# Patient Record
Sex: Male | Born: 1990 | Race: Black or African American | Hispanic: No
Health system: Southern US, Community
[De-identification: ages and names within clinical notes are randomized; demographics above are authoritative.]

---

## 2014-01-10 LAB — CBC
HCT: 44.7 % (ref 40.0–52.0)
HGB: 15 g/dL (ref 13.0–18.0)
MCH: 27.6 pg (ref 26.0–34.0)
MCHC: 33.5 g/dL (ref 32.0–36.0)
MCV: 82 fL (ref 80–100)
PLATELETS: 167 10*3/uL (ref 150–440)
RBC: 5.43 10*6/uL (ref 4.40–5.90)
RDW: 13.7 % (ref 11.5–14.5)
WBC: 14.1 10*3/uL — ABNORMAL HIGH (ref 3.8–10.6)

## 2014-01-10 LAB — COMPREHENSIVE METABOLIC PANEL
ANION GAP: 5 — AB (ref 7–16)
AST: 41 U/L — AB (ref 15–37)
Albumin: 3.8 g/dL (ref 3.4–5.0)
Alkaline Phosphatase: 44 U/L — ABNORMAL LOW
BILIRUBIN TOTAL: 0.9 mg/dL (ref 0.2–1.0)
BUN: 15 mg/dL (ref 7–18)
CALCIUM: 9.5 mg/dL (ref 8.5–10.1)
Chloride: 101 mmol/L (ref 98–107)
Co2: 27 mmol/L (ref 21–32)
Creatinine: 0.96 mg/dL (ref 0.60–1.30)
EGFR (Non-African Amer.): >60
GLUCOSE: 106 mg/dL — AB (ref 65–99)
Osmolality: 268 (ref 275–301)
Potassium: 3.8 mmol/L (ref 3.5–5.1)
SGPT (ALT): 31 U/L (ref 12–78)
Sodium: 133 mmol/L — ABNORMAL LOW (ref 136–145)
Total Protein: 9.2 g/dL — ABNORMAL HIGH (ref 6.4–8.2)

## 2014-01-11 ENCOUNTER — Inpatient Hospital Stay: Payer: Self-pay | Admitting: Specialist

## 2014-01-11 LAB — MONONUCLEOSIS SCREEN: Mono Test: NEGATIVE

## 2014-01-12 LAB — CBC WITH DIFFERENTIAL/PLATELET
BASOS ABS: 0 10*3/uL (ref 0.0–0.1)
Basophil %: 0.2 %
EOS ABS: 0 10*3/uL (ref 0.0–0.7)
Eosinophil %: 0.1 %
HCT: 42.2 % (ref 40.0–52.0)
HGB: 13.7 g/dL (ref 13.0–18.0)
LYMPHS ABS: 1.4 10*3/uL (ref 1.0–3.6)
Lymphocyte %: 8.4 %
MCH: 27.1 pg (ref 26.0–34.0)
MCHC: 32.5 g/dL (ref 32.0–36.0)
MCV: 84 fL (ref 80–100)
MONO ABS: 1.1 x10 3/mm — AB (ref 0.2–1.0)
Monocyte %: 6.8 %
NEUTROS PCT: 84.5 %
Neutrophil #: 14.1 10*3/uL — ABNORMAL HIGH (ref 1.4–6.5)
Platelet: 184 10*3/uL (ref 150–440)
RBC: 5.05 10*6/uL (ref 4.40–5.90)
RDW: 13.9 % (ref 11.5–14.5)
WBC: 16.7 10*3/uL — ABNORMAL HIGH (ref 3.8–10.6)

## 2014-01-13 LAB — BETA STREP CULTURE(ARMC)

## 2015-02-17 NOTE — Discharge Summary (Signed)
PATIENT NAME:  Jacob [name], Jacob [name] MR#:  [id number] DATE OF BIRTH:  01/17/1991  DATE OF ADMISSION:  01/11/2014 DATE OF DISCHARGE:  01/12/2014   For [name] detailed note, please see the history and physical done on admission by Dr. Randol KernElgergawy.   DIAGNOSES AT DISCHARGE: As follows: Sepsis secondary to acute tonsillitis and pharyngitis. Acute pharyngitis tonsillitis. Leukocytosis. Suspected sleep apnea.   DISPOSITION: The patient is being discharged on [name] mechanical soft regular diet.   ACTIVITY: As tolerated.   FOLLOWUP: Is with his primary care physician in [place].   DISCHARGE MEDICATIONS: Augmentin 875/125, 1 tablet twice daily x7 days and prednisone taper starting at 25 milligrams down to 5 milligrams over the next 5 days.   [facility]:  Ollen Grossaul S. Willeen CassBennett, MD from ENT.    PERTINENT STUDIES DONE DURING THE HOSPITAL COURSE: [name] CT scan of the neck done with contrast on March 17 showing enlargement and edematous tonsils bilaterally which abut each other at the midline with associated diffuse parapharyngeal swelling and edema. Findings are mostly consistent with acute tonsillitis and pharyngitis.   HOSPITAL COURSE: This is [name] 24 year old male with medical problems as mentioned above, presented to the hospital with fever and difficulty swallowing.   1. Sepsis. The patient presented with fever, tachycardia and noted to have CT scan findings suggestive of acute pharyngitis and tonsillitis, that was likely the source of the patient's sepsis. The patient was empirically started on IV Unasyn and IV steroids. Blood cultures were obtained which are currently negative. He has been afebrile and hemodynamically stable now for the past 48 hours, therefore being discharged on oral Augmentin and prednisone taper as stated.  2. Acute pharyngitis and tonsillitis. This is likely the cause of the patient's sepsis and has now since improved. The patient was seen by ENT, they did not think that the  patient needed any acute surgical intervention. He is now tolerating [name] soft diet well. He has been afebrile and hemodynamically stable. Therefore, I am discharging him on oral Augmentin and prednisone taper. He will continue followup with his doctor in [place].  3. Hypoxia and low O2 saturation. On the day after admission, the patient had some desaturations under deep sleep. His O2 saturation dropped as low as the mid 70s although the patient was clinically asymptomatic and was in deep sleep as mentioned. The patient likely has underlying sleep apnea. Therefore, he was advised to get [name] sleep study done as an outpatient.   CODE STATUS: The patient is [name] FULL CODE.   TIME SPENT: 40 minutes.   ____________________________ Rolly PancakeVivek J. Cherlynn KaiserSainani, MD [id number] D: 01/12/2014 16:21:08 ET T: 01/12/2014 23:24:34 ET JOB#: [id number]  cc: Rolly PancakeVivek J. Cherlynn KaiserSainani, MD, <Dictator> Houston SirenVIVEK J Lilac Hoff MD ELECTRONICALLY SIGNED 01/20/2014 20:26

## 2015-02-17 NOTE — Consult Note (Signed)
PATIENT NAME:  Jacob [name], [name] MR#:  [id number] DATE OF BIRTH:  1990-12-22  DATE OF CONSULTATION:  01/11/2014  REFERRING PHYSICIAN:  Dr. Randol KernElgergawy CONSULTING PHYSICIAN:  Ollen GrossPaul S. Willeen CassBennett, MD  REASON FOR CONSULTATION: Tonsillitis.   HISTORY OF PRESENT ILLNESS: This 24 year old male was admitted to the hospital last night for acute tonsillitis. He had an elevated white count of 14.1 with sore throat, difficulty swallowing and fever as high as 103. Sore throat had been bothering him for the past 2 days and was associated with throat pain, chills, difficulty swallowing and swelling of the glans of the neck. He has not had problems with tonsillitis in the past. CT scan of the neck showed tonsillitis with no evidence of abscess. There was no evidence of any deep neck abscess, just some adenopathy.   PAST MEDICAL HISTORY: None.   PAST SURGICAL HISTORY: None.   HOME MEDICATIONS: None.   FAMILY HISTORY: Significant for hypertension.   SOCIAL HISTORY: He does smoke cigars, but denies alcohol use or drug use.   REVIEW OF SYSTEMS: He has had fevers, chills, fatigue and weakness. He has not had any ear pain, hearing loss, tinnitus, epistaxis, nasal congestion, shortness of breath, cough, nausea, vomiting, chest pain or syncope. He has not had any rash.   PHYSICAL EXAMINATION:  VITAL SIGNS: Temperature 98.6, pulse 69 and blood pressure 114/73.  GENERAL: Well-developed, well-nourished male in no acute distress with no difficulty breathing or stridor.  HEENT: Head and face: Head is normocephalic, atraumatic. No facial skin lesions. Ears: External ears are unremarkable. Ear canals are free of cerumen. Tympanic membranes are clear bilaterally. Nose: External nose unremarkable. Nasal cavity is clear without purulence or polyps. The septum is midline. Oral cavity and oropharynx: Teeth, lips, and gums unremarkable. Tongue and floor of mouth are without edema or lesions. Posterior pharynx reveals 3+ cryptic  tonsils with erythema and scant exudate. The uvula is midline. There is no evidence of abscess.  NECK: The neck is supple with tender jugulodigastric adenopathy. Salivary glands are soft and nontender without masses. There is no thyromegaly.  NEUROLOGIC: Cranial nerves II through XII are grossly intact.   DATA REVIEW: I reviewed his CT scan report, which showed the sinuses to be clear and evidence of bilateral tonsil enlargement without evidence of abscess. There was no retropharyngeal fluid collection seen in the hypopharynx and larynx were normal. He did have some small nodes in the neck, the largest being 2 cm in size on the left. Monospot testing was found to be normal.   ASSESSMENT: The patient with acute tonsillitis, but no evidence of tonsil abscess. He does have quite a bit of tonsillar enlargement with this episode.   PLAN: I agree with his current treatment with IV Unasyn and dexamethasone. He could potentially be discharged home on Augmentin and a Sterapred taper to help further reduce the swelling in the tonsils. If has further problems with tonsillitis, he may want to follow up locally in [place] to see an ENT regarding possible tonsillectomy, but there has not been a history of this in the past.  ____________________________ Ollen GrossPaul S. Willeen CassBennett, MD psb:aw D: 01/11/2014 13:24:22 ET T: 01/11/2014 13:47:29 ET JOB#: [id number]  cc: Ollen GrossPaul S. Willeen CassBennett, MD, <Dictator> Sandi MealyPAUL S Kyjuan Gause MD ELECTRONICALLY SIGNED 01/18/2014 8:42

## 2015-02-17 NOTE — H&P (Signed)
PATIENT NAME:  Jacob [name], Jacob [name] MR#:  [id number] DATE OF BIRTH:  Oct 23, 1991  DATE OF ADMISSION:  01/11/2014  REFERRING PHYSICIAN: Dr. Harvest Dark.   PRIMARY CARE PHYSICIAN: None.   CHIEF COMPLAINT: Sore throat.   HISTORY OF PRESENT ILLNESS: This is a 24 year old male without significant past medical history who presents with complaints of sore throat. Reports has been going on for the last 2 days. As well reports some fever, some chills, some dysphagia and odynophagia and some swollen glands in the lymph nodes. As well, he reports some generalized weakness. Upon presentation, the patient was febrile at 103, tachycardic in the 120s. As well, he had significant leukocytosis. The patient had CT neck which did show large and edematous tonsils bilaterally with diffuse parapharyngeal swelling, with narrowing of oropharyngeal airway which remains midline in this patient. No deep space abscess could be appreciated with adenopathy. ED discussed with ENT on call who requested admission and for the patient to be started on IV Decadron and antibiotics.   PAST MEDICAL HISTORY: None.   PAST SURGICAL HISTORY: None.   HOME MEDICATIONS: None.   FAMILY HISTORY: Significant for hypertension.   SOCIAL HISTORY: The patient smokes cigars. No alcohol. No illicit drug use.   REVIEW OF SYSTEMS:  CONSTITUTIONAL: Reports fever, chills, fatigue, weakness.  EYES: Denies blurry vision, double vision, inflammation, glaucoma.  ENT: Denies tinnitus, ear pain, hearing loss, allergies, epistaxis, discharge. Reports sore throat and difficulty swallowing.  RESPIRATORY: Denies any shortness of breath. Denies any cough, wheezing, hemoptysis, dyspnea.  CARDIOVASCULAR: Denies chest pain, edema, arrhythmia, palpitation, syncope.  GASTROINTESTINAL: Denies nausea, vomiting, diarrhea, abdominal pain, hematemesis.  GENITOURINARY: Denies dysuria, hematuria, renal colic.  ENDOCRINE: Denies polyuria, polydipsia, heat or cold  intolerance.  HEMATOLOGY: Denies anemia, easy bruising, bleeding diathesis.  INTEGUMENT: Denies acne, rash or skin lesions.  MUSCULOSKELETAL: Denies any joint effusion, any swelling, gout or cramps.  NEUROLOGIC: Denies CVA, TIA, headache, ataxia, vertigo.  PSYCHIATRIC: Denies anxiety, insomnia or depression.   PHYSICAL EXAMINATION:  VITAL SIGNS: Temperature 103, pulse 102, respiratory rate 18, blood pressure 128/63, saturating 96% on room air. Pulse upon admission was 122.   GENERAL: Well-nourished male, looks comfortable in bed, in no apparent distress.  HEENT: Head atraumatic, normocephalic. Pupils equal, reactive to light. Pink conjunctivae. Anicteric sclerae Moist oral mucosa. The patient has tonsillar swelling with mild erythema. NECK: Has swollen lymph glands in the neck bilaterally. No thyromegaly. No JVD. No carotid bruits.  CHEST: Good air entry bilaterally. No wheezing, rales or rhonchi.  CARDIOVASCULAR: S1, S2 heard. No rubs, murmurs or gallops.  ABDOMEN: Soft, nontender, nondistended. Bowel sounds present.  EXTREMITIES: No edema. No clubbing. No cyanosis.  PSYCHIATRIC: Appropriate affect. Awake, alert x 3. Intact judgment and insight.  NEUROLOGIC: Cranial nerves grossly intact. Motor 5 out of 5. No focal deficits.  SKIN: Warm and dry. Normal skin turgor.  MUSCULOSKELETAL: No joint effusion or erythema.   PERTINENT LABORATORIES: Glucose 106, BUN 15, creatinine 0.96, sodium 133, potassium 3.8, chloride 101. ALT 31, AST 41, alk phos 44. White blood cells 14.1, hemoglobin 15.1, hematocrit 44.7, platelets 167.   CT neck with contrast showing enlarged and edematous tonsils bilaterally which abut each other at the midline with diffuse parapharyngeal swelling and edema.  ASSESSMENT AND PLAN:  1. Sepsis: Currently, the patient meets sepsis criteria. He is febrile, tachycardic and significant leukocytosis. His source of sepsis most likely is related to his tonsillitis and pharyngitis. The  patient will be started on intravenous antibiotics and fluids.  2. Emergency Department physician discussed with Ear, Nose and Throat on call. He will be started on intravenous Solu-Medrol and intravenous Unasyn and will follow on his Streptococcus culture and swab and mono test. Will keep on intravenous fluids. Will have the patient on clear liquid diet. Will advance as tolerated. Will have him on p.r.n. pain and nausea medicine. Will have him on Cepacol lozenges and  spray as needed.  3. Tobacco abuse: The patient was counseled.  4. Gastrointestinal prophylaxis: The patient will be started on Protonix as he is on Decadron.  5. Deep vein thrombosis prophylaxis: On subcutaneous heparin.   CODE STATUS: FULL CODE.   TOTAL TIME SPENT ON ADMISSION AND PATIENT CARE: 45 minutes.    ____________________________ Albertine Patricia, MD dse:gb D: 01/11/2014 00:56:36 ET T: 01/11/2014 01:53:20 ET JOB#: [id number]  cc: Albertine Patricia, MD, <Dictator> DAWOOD Graciela Husbands MD ELECTRONICALLY SIGNED 01/16/2014 1:43

## 2015-05-01 IMAGING — CT CT NECK WITH CONTRAST
4 of 5 series · 16 of 33 positions shown, 18 images · IV contrast (isovue)
Comparison: None available

CLINICAL DATA: Sore throat, fever.  Evaluate for deep space abscess

EXAM:
CT NECK WITH CONTRAST
TECHNIQUE: Multidetector CT imaging of the neck was performed using the
standard protocol following the bolus administration of intravenous
contrast.
CONTRAST:  75 cc of Isovue 370

[Series 4: sag neck · sagittal · 0.54mm/px · 5 of 81 slices shown, 6 images]
[im 27/81  bone]
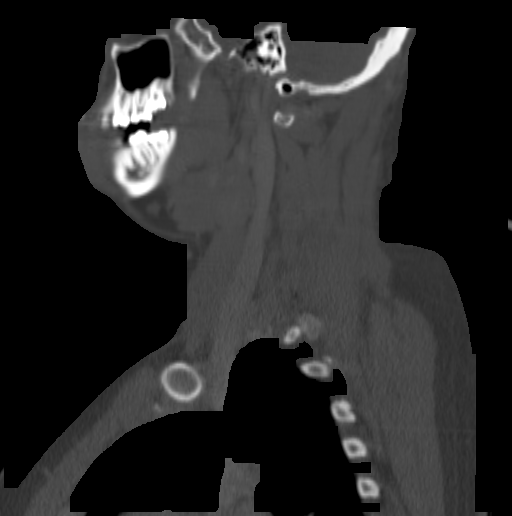
[im 34/81  bone]
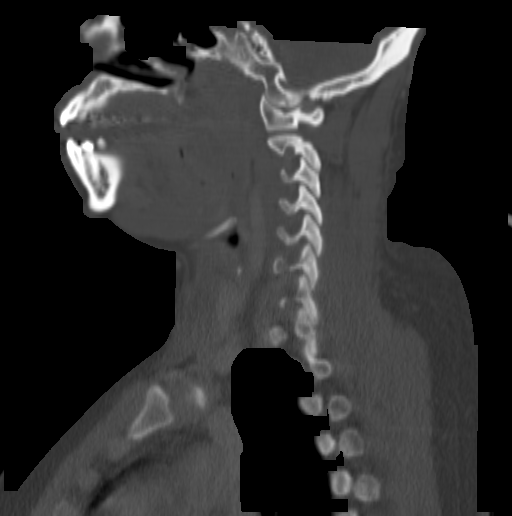
[im 41/81  soft-tissue]
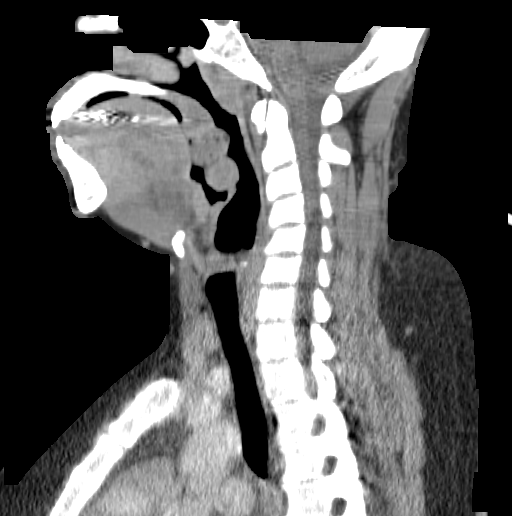
[im 41/81  bone]
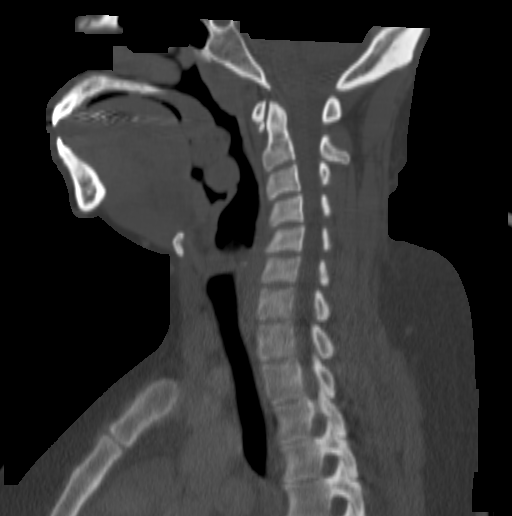
[im 47/81  bone]
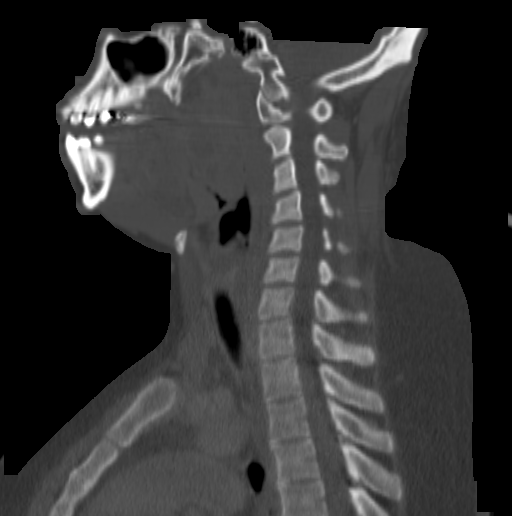
[im 54/81  bone]
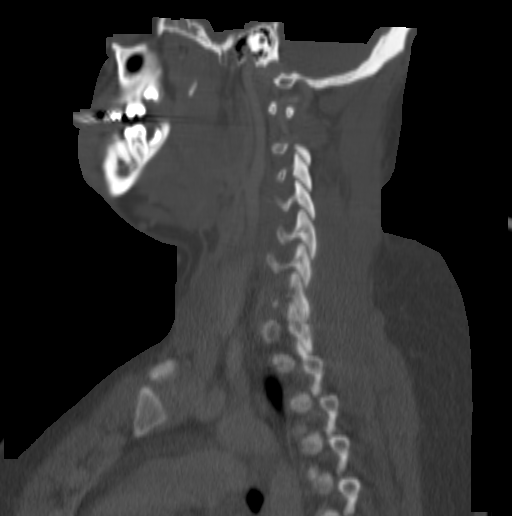

[Series 5: cor neck · coronal · 0.50mm/px · 3 of 125 slices shown]
[im 25/125  bone]
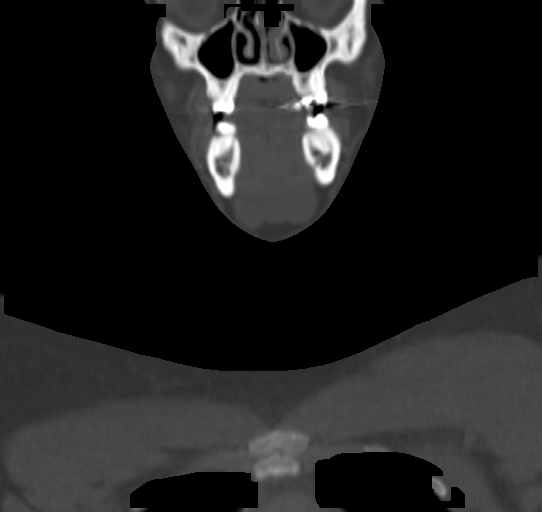
[im 50/125  bone]
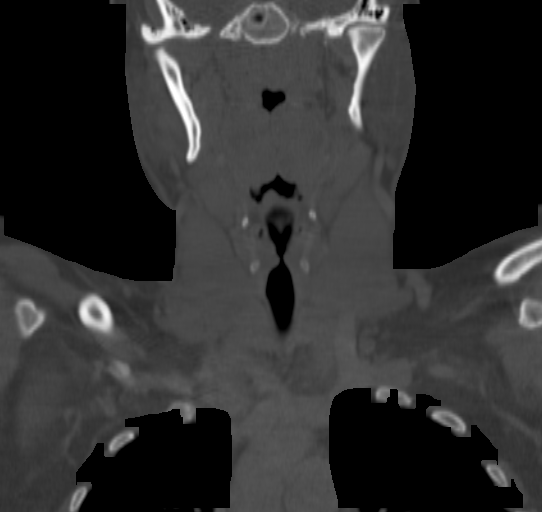
[im 75/125  bone]
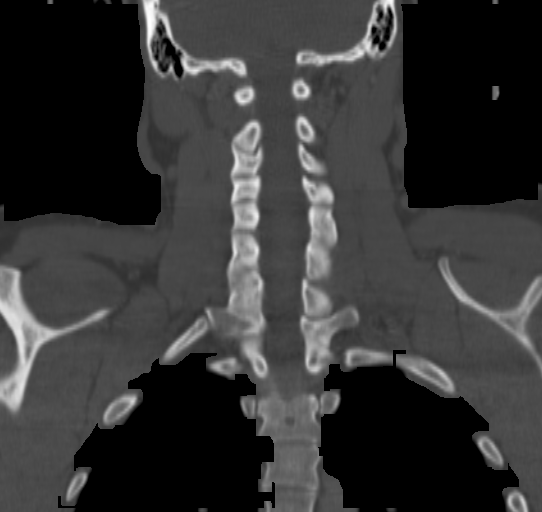

[Series 6: ax oropharynx · axial · 0.52mm/px · z∈[-294,-148]mm · 4 of 125 slices shown, 5 images]
[im 25/125  soft-tissue]
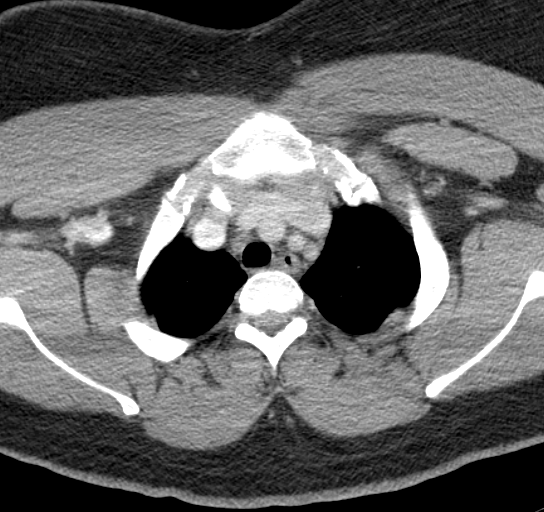
[im 25/125  bone]
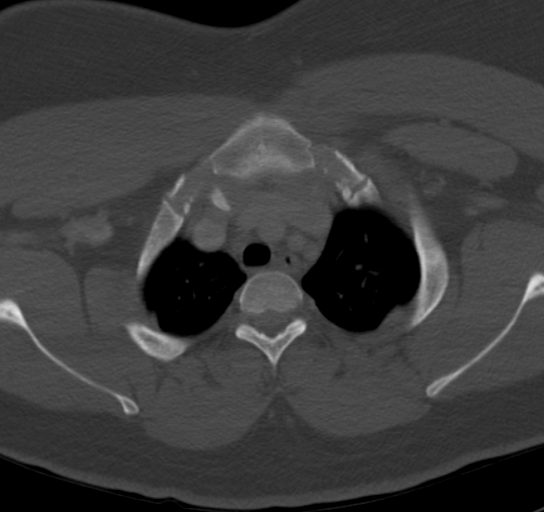
[im 50/125  bone]
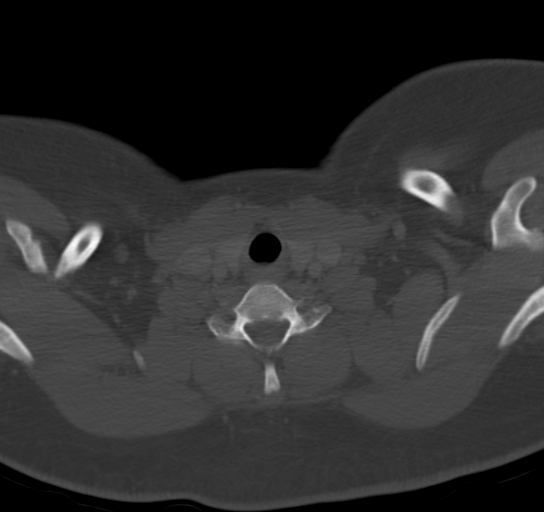
[im 75/125  bone]
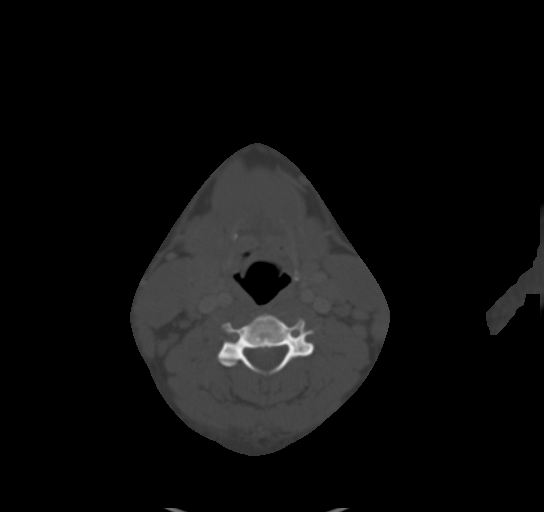
[im 100/125  bone]
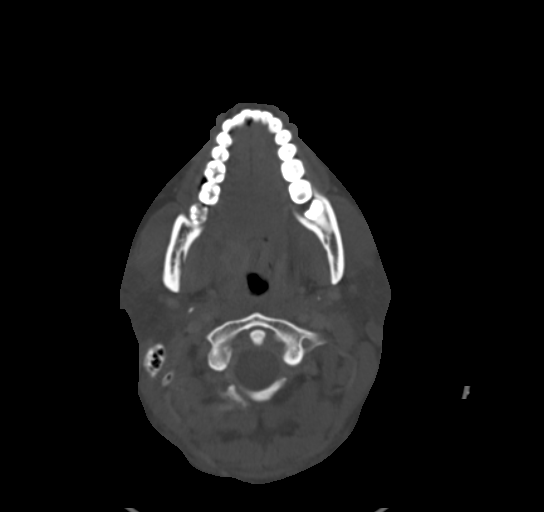

[Series 8: axial neck 2 · axial · 0.53mm/px · z∈[-270,-114]mm · 4 of 130 slices shown]
[im 26/130  bone]
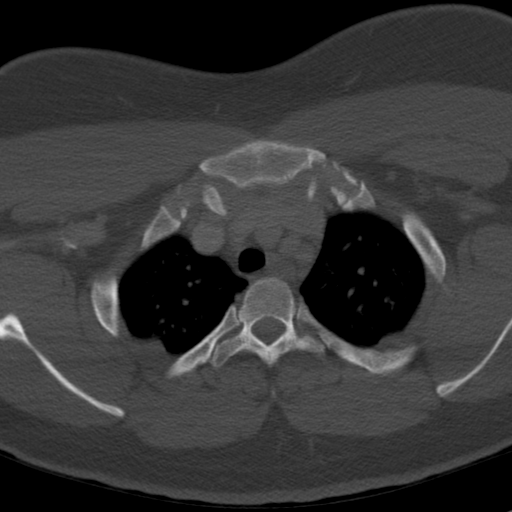
[im 52/130  bone]
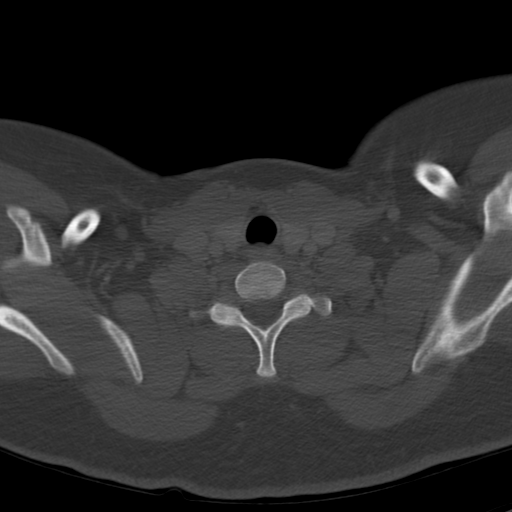
[im 78/130  bone]
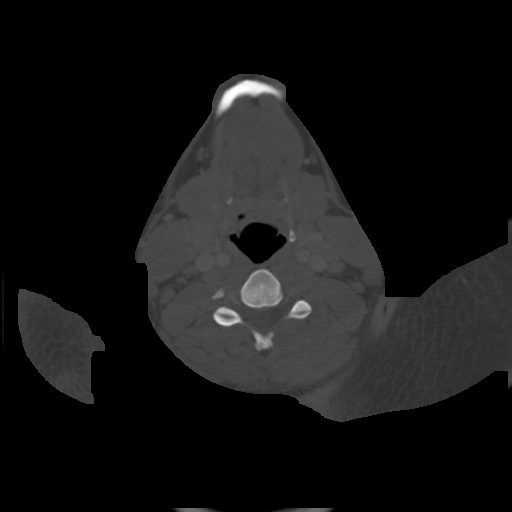
[im 104/130  bone]
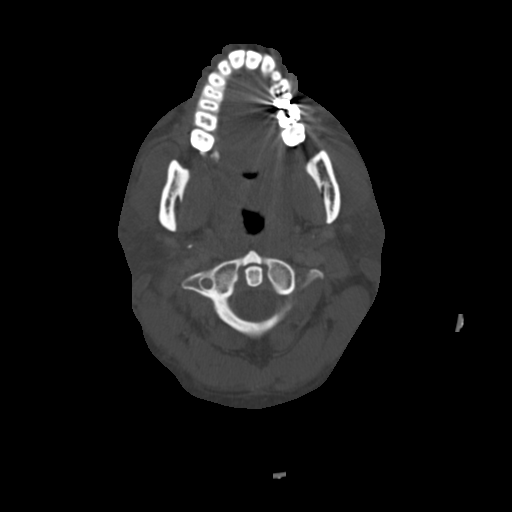

[16 of 33 positions shown; findings below may reference images not displayed]

FINDINGS: Visualized portions of the brain and orbits are unremarkable.

Visualized paranasal sinuses are clear.  No mastoid effusion.

The salivary glands including the parotid glands and submandibular
glands are within normal limits.

The oral cavity is normal. The palatine tonsils are enlarged and
edematous in appearance bilaterally and abut each other at the
midline. There is partial effacement of the parapharyngeal fat
bilaterally. No loculated fluid collection to suggest abscess
identified within the tonsils in cells or peritonsillar region.
There is narrowing of the air or pharyngeal airway which remains
midline and patent. The adenoidal tissues appear swollen as well as
to the lingual tonsils.

No retropharyngeal fluid collection. The hypopharynx and
supraglottic larynx are normal. True vocal cords are symmetric in
normal appearance bilaterally. Subglottic airway widely patent.

The thyroid gland is normal.

Prominent left level 2 adenopathy measuring up to 2 cm in short axis
is present. Right level 2 adenopathy measures up to 1.5 cm in short
axis. Additional scattered shotty adenopathy in noted within the
neck. No pathologically enlarged lymph nodes seen within the
superior mediastinum.

The visualized lungs are clear.

Normal intravascular enhancement seen within the neck.

No acute osseous abnormality. Reversal of the normal cervical
lordosis noted with apex at C4. No worrisome lytic or blastic
osseous lesions.
IMPRESSION: 1. Enlarged and edematous tonsils bilaterally which abut each other
at the midline with associated diffuse parapharyngeal
swelling/edema. Findings are most consistent with acute tonsillitis/
pharyngitis. There is narrowing of the oropharyngeal airway which
remains midline and patent at this time. No deep space abscess
identified.
2. Bilateral level II adenopathy is above, likely reactive in
nature.
